# Patient Record
Sex: Female | Born: 1995 | Race: White | Marital: Single | Smoking: Never smoker
Health system: Southern US, Community
[De-identification: ages and names within clinical notes are randomized; demographics above are authoritative.]

## PROBLEM LIST (undated history)

## (undated) DIAGNOSIS — F419 Anxiety disorder, unspecified: Secondary | ICD-10-CM

## (undated) DIAGNOSIS — J302 Other seasonal allergic rhinitis: Secondary | ICD-10-CM

## (undated) DIAGNOSIS — G43909 Migraine, unspecified, not intractable, without status migrainosus: Secondary | ICD-10-CM

## (undated) DIAGNOSIS — R200 Anesthesia of skin: Secondary | ICD-10-CM

## (undated) HISTORY — PX: MANDIBLE FRACTURE SURGERY: SHX706

## (undated) HISTORY — DX: Other seasonal allergic rhinitis: J30.2

## (undated) HISTORY — DX: Anxiety disorder, unspecified: F41.9

## (undated) HISTORY — DX: Anesthesia of skin: R20.0

## (undated) HISTORY — DX: Migraine, unspecified, not intractable, without status migrainosus: G43.909

## (undated) HISTORY — PX: TONSILLECTOMY: SUR1361

---

## 2015-05-15 DIAGNOSIS — R200 Anesthesia of skin: Secondary | ICD-10-CM | POA: Insufficient documentation

## 2015-05-15 HISTORY — DX: Anesthesia of skin: R20.0

## 2016-11-26 ENCOUNTER — Telehealth: Payer: Self-pay | Admitting: Surgery

## 2016-11-26 NOTE — Telephone Encounter (Signed)
Left voice message for patient to call and schedule appointment with a surgeon for periumbilical abdominal pain. [facility]

## 2016-11-30 ENCOUNTER — Other Ambulatory Visit: Payer: Self-pay

## 2016-12-02 ENCOUNTER — Ambulatory Visit: Payer: BLUE CROSS/BLUE SHIELD | Admitting: Surgery

## 2016-12-03 ENCOUNTER — Other Ambulatory Visit: Payer: Self-pay

## 2016-12-03 DIAGNOSIS — J302 Other seasonal allergic rhinitis: Secondary | ICD-10-CM | POA: Insufficient documentation

## 2016-12-03 DIAGNOSIS — F419 Anxiety disorder, unspecified: Secondary | ICD-10-CM

## 2016-12-03 DIAGNOSIS — G43909 Migraine, unspecified, not intractable, without status migrainosus: Secondary | ICD-10-CM | POA: Insufficient documentation

## 2016-12-03 HISTORY — DX: Anxiety disorder, unspecified: F41.9

## 2016-12-04 ENCOUNTER — Encounter: Payer: Self-pay | Admitting: General Surgery

## 2016-12-04 ENCOUNTER — Ambulatory Visit (INDEPENDENT_AMBULATORY_CARE_PROVIDER_SITE_OTHER): Payer: BLUE CROSS/BLUE SHIELD | Admitting: General Surgery

## 2016-12-04 VITALS — BP 117/74 | HR 81 | Temp 97.4°F | Ht 67.0 in | Wt 166.4 lb

## 2016-12-04 DIAGNOSIS — R1033 Periumbilical pain: Secondary | ICD-10-CM

## 2016-12-04 NOTE — Patient Instructions (Signed)
Please call our office if you have any questions or concerns.   Umbilical Hernia, Adult A hernia is a bulge of tissue that pushes through an opening between muscles. An umbilical hernia happens in the abdomen, near the belly button (umbilicus). The hernia may contain tissues from the small intestine, large intestine, or fatty tissue covering the intestines (omentum). Umbilical hernias in adults tend to get worse over time, and they require surgical treatment. There are several types of umbilical hernias. You may have:  A hernia located just above or below the umbilicus (indirect hernia). This is the most common type of umbilical hernia in adults.  A hernia that forms through an opening formed by the umbilicus (direct hernia).  A hernia that comes and goes (reducible hernia). A reducible hernia may be visible only when you strain, lift something heavy, or cough. This type of hernia can be pushed back into the abdomen (reduced).  A hernia that traps abdominal tissue inside the hernia (incarcerated hernia). This type of hernia cannot be reduced.  A hernia that cuts off blood flow to the tissues inside the hernia (strangulated hernia). The tissues can start to die if this happens. This type of hernia requires emergency treatment. What are the causes? An umbilical hernia happens when tissue inside the abdomen presses on a weak area of the abdominal muscles. What increases the risk? You may have a greater risk of this condition if you:  Are obese.  Have had several pregnancies.  Have a buildup of fluid inside your abdomen (ascites).  Have had surgery that weakens the abdominal muscles. What are the signs or symptoms? The main symptom of this condition is a painless bulge at or near the belly button. A reducible hernia may be visible only when you strain, lift something heavy, or cough. Other symptoms may include:  Dull pain.  A feeling of pressure. Symptoms of a strangulated hernia may  include:  Pain that gets increasingly worse.  Nausea and vomiting.  Pain when pressing on the hernia.  Skin over the hernia becoming red or purple.  Constipation.  Blood in the stool. How is this diagnosed? This condition may be diagnosed based on:  A physical exam. You may be asked to cough or strain while standing. These actions increase the pressure inside your abdomen and force the hernia through the opening in your muscles. Your health care provider may try to reduce the hernia by pressing on it.  Your symptoms and medical history. How is this treated? Surgery is the only treatment for an umbilical hernia. Surgery for a strangulated hernia is done as soon as possible. If you have a small hernia that is not incarcerated, you may need to lose weight before having surgery. Follow these instructions at home:  Lose weight, if told by your health care provider.  Do not try to push the hernia back in.  Watch your hernia for any changes in color or size. Tell your health care provider if any changes occur.  You may need to avoid activities that increase pressure on your hernia.  Do not lift anything that is heavier than 10 lb (4.5 kg) until your health care provider says that this is safe.  Take over-the-counter and prescription medicines only as told by your health care provider.  Keep all follow-up visits as told by your health care provider. This is important. Contact a health care provider if:  Your hernia gets larger.  Your hernia becomes painful. Get help right away if:  You develop sudden, severe pain near the area of your hernia.  You have pain as well as nausea or vomiting.  You have pain and the skin over your hernia changes color.  You develop a fever. This information is not intended to replace advice given to you by your health care provider. Make sure you discuss any questions you have with your health care provider. Document Released: 03/20/2016 Document  Revised: 06/21/2016 Document Reviewed: 03/20/2016 Elsevier Interactive Patient Education  2017 [employer].

## 2016-12-04 NOTE — Progress Notes (Signed)
Patient ID: Margaret Beck, female   DOB: 1996-04-22, 21 y.o.   MRN: [id number]  CC: Abdominal pain  HPI Margaret Beck is a 21 y.o. female presents to clinic today for evaluation of abdominal pain. The pain is always above her umbilicus and does not radiate and does not move. She'll he notices it when she strains hard, coughs, sneezes. It first started approximately 5 months ago and then went away for several months and then restarted a couple weeks ago. She states is not every day and only every now and then and usually goes away within a few minutes. She denies any fevers, chills, nausea, vomiting, chest pain, shortness breath, diarrhea, constipation.  HPI  Past Medical History:  Diagnosis Date  . Anxiety 12/03/2016   Overview:  Occasional when stressed out  . Migraine   . Numbness 05/15/2015  . Seasonal allergies     Past Surgical History:  Procedure Laterality Date  . MANDIBLE FRACTURE SURGERY    . TONSILLECTOMY      Family History  Problem Relation Age of Onset  . Anxiety disorder Mother   . Asthma Mother   . Depression Mother   . Colon polyps Father   . Hyperlipidemia Father   . Heart disease Maternal Grandmother   . Skin cancer Maternal Grandmother   . Colon cancer Maternal Grandfather   . Colon cancer Paternal Grandfather     Social History Social History  Substance Use Topics  . Smoking status: Never Smoker  . Smokeless tobacco: Never Used  . Alcohol use No    No Known Allergies  Current Outpatient Prescriptions  Medication Sig Dispense Refill  . fluticasone (FLONASE) 50 MCG/ACT nasal spray Place 2 sprays into the nose daily.     No current facility-administered medications for this visit.      Review of Systems A Multi-point review of systems was asked and was negative except for the findings documented in the history of present illness  Physical Exam Blood pressure 117/74, pulse 81, temperature 97.4 F (36.3 C), temperature source Oral, height 5\' 7"   (1.702 m), weight 75.5 kg (166 lb 6.4 oz), last menstrual period 12/04/2016. CONSTITUTIONAL: No acute distress. EYES: Pupils are equal, round, and reactive to light, Sclera are non-icteric. EARS, NOSE, MOUTH AND THROAT: The oropharynx is clear. The oral mucosa is pink and moist. Hearing is intact to voice. LYMPH NODES:  Lymph nodes in the neck are normal. RESPIRATORY:  Lungs are clear. There is normal respiratory effort, with equal breath sounds bilaterally, and without pathologic use of accessory muscles. CARDIOVASCULAR: Heart is regular without murmurs, gallops, or rubs. GI: The abdomen is soft, nontender, and nondistended. There are no palpable masses. There is no hepatosplenomegaly. There are normal bowel sounds in all quadrants. Possible area of fascial disruption just cephalad to the umbilicus but without bulge or tenderness. GU: Rectal deferred.   MUSCULOSKELETAL: Normal muscle strength and tone. No cyanosis or edema.   SKIN: Turgor is good and there are no pathologic skin lesions or ulcers. NEUROLOGIC: Motor and sensation is grossly normal. Cranial nerves are grossly intact. PSYCH:  Oriented to person, place and time. Affect is normal.  Data Reviewed No images and labs reviewed for this encounter I have personally reviewed the patient's imaging, laboratory findings and medical records.    Assessment    Periumbilical pain    Plan    21 year old female with periumbilical pain that happens with changes and abdominal pressure. This is likely a small ventral hernia. Unable to  fully of appreciated on exam today. Counseled her just to the diagnosis as well as the treatment options. At this point given its small size and minimal symptoms discussed that repair would be purely elective. Counseled as to the signs and symptoms of incarceration or straining relation of her hernia and to report immediately to the hospital should they occur. Otherwise discussed that should become more symptomatic  for the patient desires surgical repair that she is to call clinic follow-up at that point. She voiced understanding and will follow up on an as-needed basis.     Time spent with the patient was 45 minutes, with more than 50% of the time spent in face-to-face education, counseling and care coordination.     Margaret Frameharles Javarri Segal, MD FACS General Surgeon 12/04/2016, 9:59 AM

## 2017-02-19 ENCOUNTER — Ambulatory Visit: Payer: BLUE CROSS/BLUE SHIELD | Admitting: General Surgery

## 2017-02-22 ENCOUNTER — Encounter: Payer: Self-pay | Admitting: General Surgery

## 2017-02-22 ENCOUNTER — Ambulatory Visit (INDEPENDENT_AMBULATORY_CARE_PROVIDER_SITE_OTHER): Payer: BLUE CROSS/BLUE SHIELD | Admitting: General Surgery

## 2017-02-22 VITALS — BP 110/72 | HR 89 | Temp 98.2°F | Ht 67.0 in | Wt 168.0 lb

## 2017-02-22 DIAGNOSIS — R1033 Periumbilical pain: Secondary | ICD-10-CM

## 2017-02-22 NOTE — Patient Instructions (Addendum)
We scheduled your Ultrasound for 03/01/17 @ 7:45 am. Please do not have anything to eat or drink after midnight the night prior to having your scan.  It is scheduled at the  Outpatient imaging [address] [place]  Please see your follow up appointment listed below. Please call if you have questions or concerns.

## 2017-02-22 NOTE — Progress Notes (Signed)
Outpatient Surgical Follow Up  02/22/2017  Margaret Beck is an 21 y.o. female.   Chief Complaint  Patient presents with  . Follow-up    Discuss Ventral Hernia Surgery    HPI: 21 year old female returns to clinic for follow-up of abdominal pain. Patient reports she was last seen she has continued to have intermittent pains with activities. She had one event where she was bowling a few weeks ago where afterwards she had pain followed by nausea and vomiting. She denies any bulge or having to reduce anything. Since that time she has only had occasional twinges of pain. She denies any changes in bowel habits. She denies any fevers, chills, chest pain, shortness of breath, diarrhea, constipation.  Past Medical History:  Diagnosis Date  . Anxiety 12/03/2016   Overview:  Occasional when stressed out  . Migraine   . Numbness 05/15/2015  . Seasonal allergies     Past Surgical History:  Procedure Laterality Date  . MANDIBLE FRACTURE SURGERY    . TONSILLECTOMY      Family History  Problem Relation Age of Onset  . Anxiety disorder Mother   . Asthma Mother   . Depression Mother   . Colon polyps Father   . Hyperlipidemia Father   . Heart disease Maternal Grandmother   . Skin cancer Maternal Grandmother   . Colon cancer Maternal Grandfather   . Colon cancer Paternal Grandfather     Social History:  reports that she has never smoked. She has never used smokeless tobacco. She reports that she does not drink alcohol or use drugs.  Allergies: No Known Allergies  Medications reviewed.    ROS A multipoint review of systems was completed, all pertinent positives and negatives are documented in the history of present illness and remainder are negative    BP 110/72   Pulse 89   Temp 98.2 F (36.8 C) (Oral)   Ht  (1.702 m)   Wt 76.2 kg (168 lb)   LMP 01/19/2017   BMI 26.31 kg/m   Physical Exam Gen.: No acute distress Chest: Clear to auscultation Heart: Regular rate and  rhythm Abdomen: Soft, nondistended. Mild tenderness to deep palpation just above the umbilicus. No bulges clearly felt nor is there any obvious fascial defect even with Valsalva. Extremities: Moves all surgeries well    No results found for this or any previous visit (from the past 48 hour(s)). No results found.  Assessment/Plan:  1. Periumbilical abdominal pain 21 year old female with periumbilical pain that is worsened with activity. There is been a question of a hernia there for the last 2 visits without a clear palpable fascial defect. Discussed that we will obtain an ultrasound of the area to look for a fascial defect prior to embarking on any surgical intervention. All questions answered to patient's satisfaction. She'll follow-up in clinic after the ultrasound. - US Abdomen Complete; Future     Ricarda Frame, MD [facility] General Surgeon  02/22/2017,10:37 AM

## 2017-03-01 ENCOUNTER — Ambulatory Visit
Admission: RE | Admit: 2017-03-01 | Discharge: 2017-03-01 | Disposition: A | Discharge status: Home / Self Care | Payer: BLUE CROSS/BLUE SHIELD | Source: Ambulatory Visit | Attending: General Surgery | Admitting: General Surgery

## 2017-03-01 ENCOUNTER — Other Ambulatory Visit: Payer: Self-pay

## 2017-03-01 DIAGNOSIS — R1033 Periumbilical pain: Secondary | ICD-10-CM | POA: Insufficient documentation

## 2017-03-08 ENCOUNTER — Telehealth: Payer: Self-pay

## 2017-03-08 ENCOUNTER — Telehealth: Payer: Self-pay | Admitting: General Practice

## 2017-03-08 NOTE — Telephone Encounter (Signed)
Patient notified of Ultrasound results per Dr.Woodham . No need to keep the scheduled appointment. Patient verbalized understanding.

## 2017-03-08 NOTE — Telephone Encounter (Signed)
Patient called and is asking for her results from an ultrasound she had. Please call patient and advice.

## 2017-03-09 NOTE — Telephone Encounter (Signed)
error 

## 2017-03-17 ENCOUNTER — Ambulatory Visit: Payer: BLUE CROSS/BLUE SHIELD | Admitting: General Surgery

## 2017-09-04 IMAGING — US US ABDOMEN LIMITED
1 series · 10 of 10 positions shown · non-contrast
Comparison: None.

CLINICAL DATA: Periumbilical pain for several months

EXAM:
LIMITED ABDOMINAL ULTRASOUND

[Series 1: us abdomen limited · 0.08mm/px · 10 of 10 slices shown]
[im 1/10]
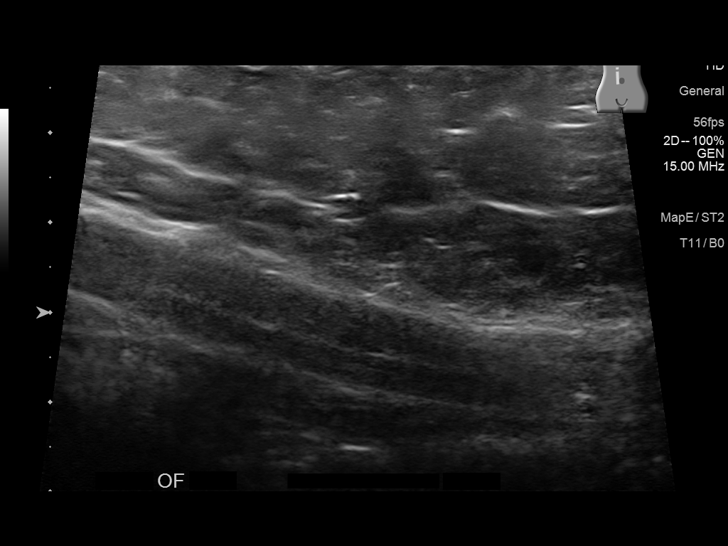
[im 2/10]
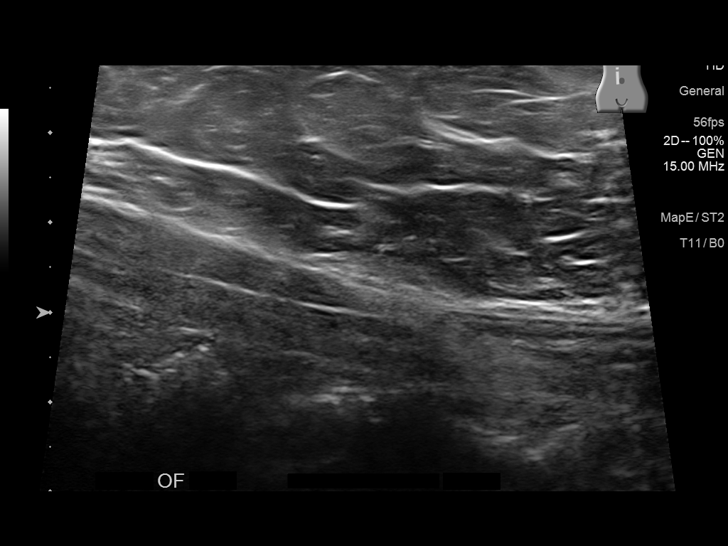
[im 3/10]
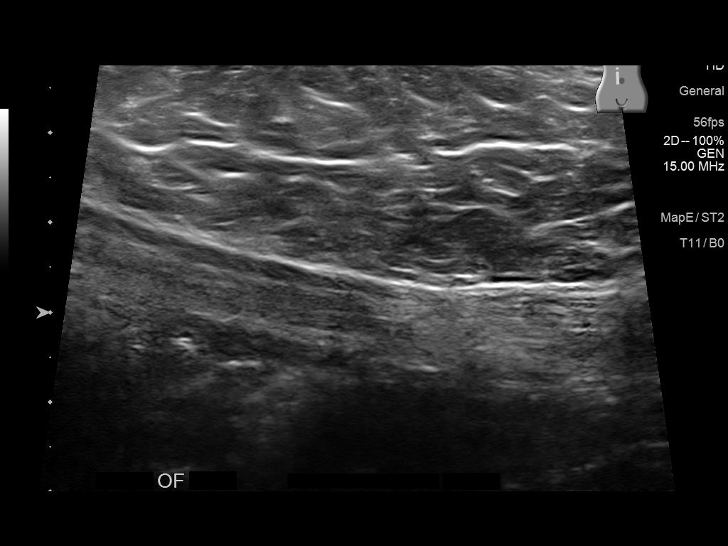
[im 4/10]
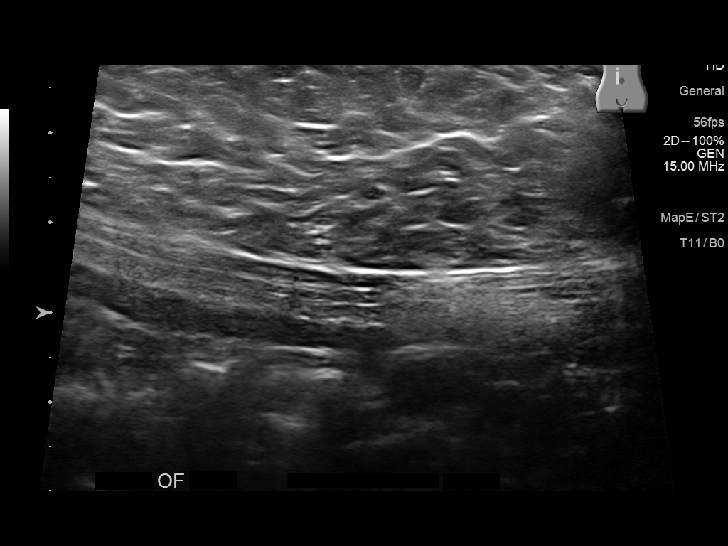
[im 5/10]
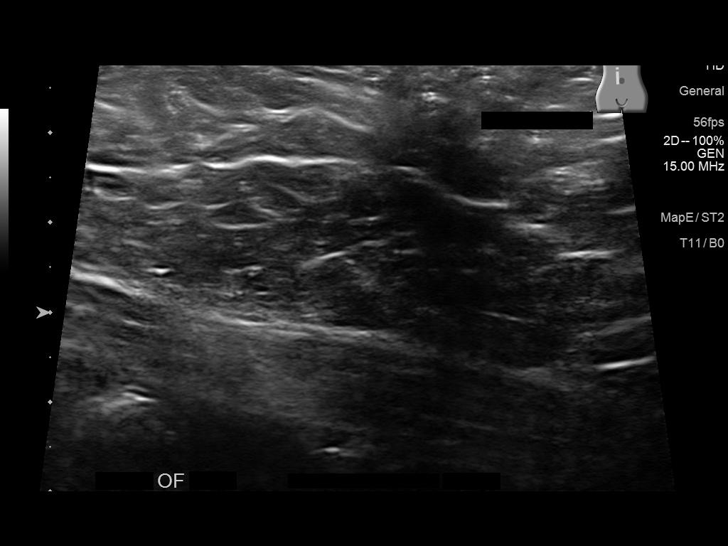
[im 6/10]
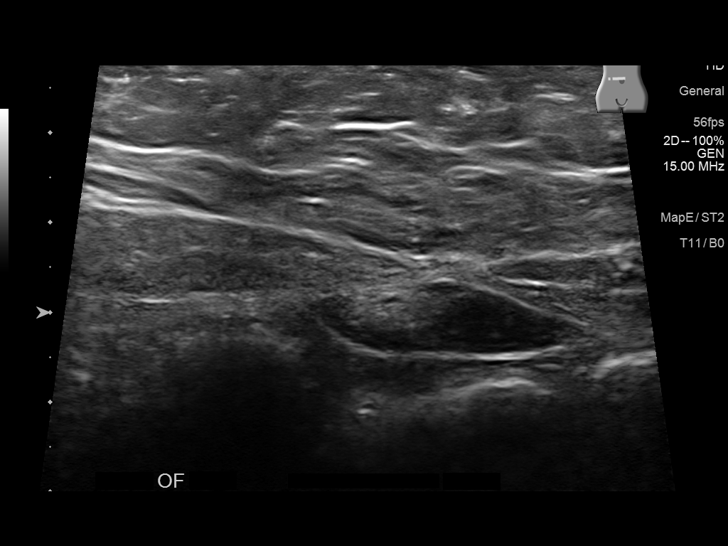
[im 7/10]
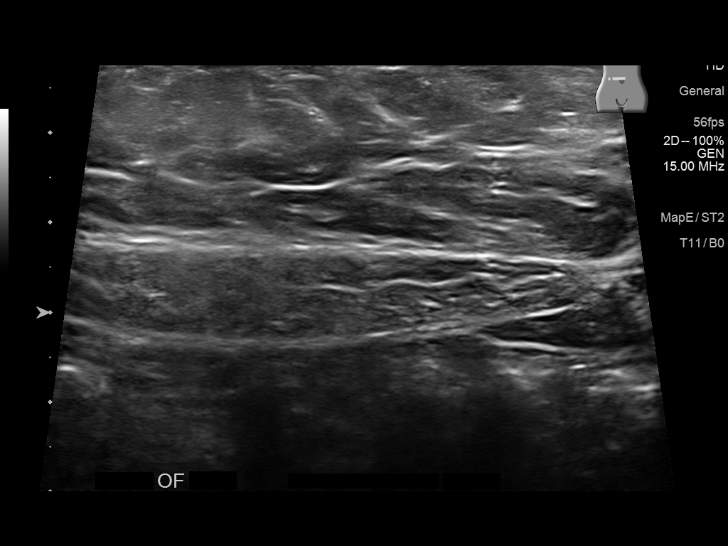
[im 8/10]
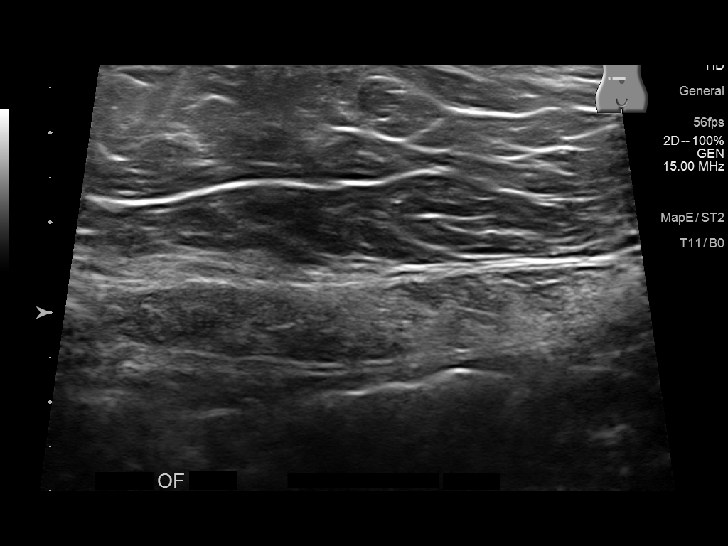
[im 9/10]
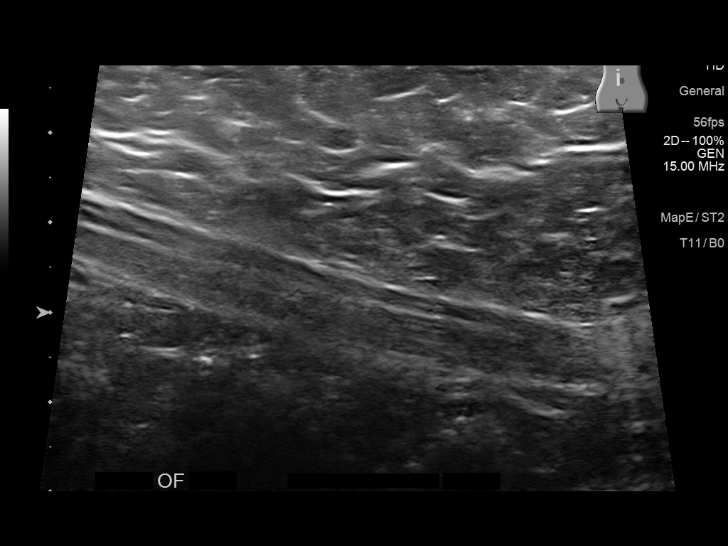
[im 10/10]
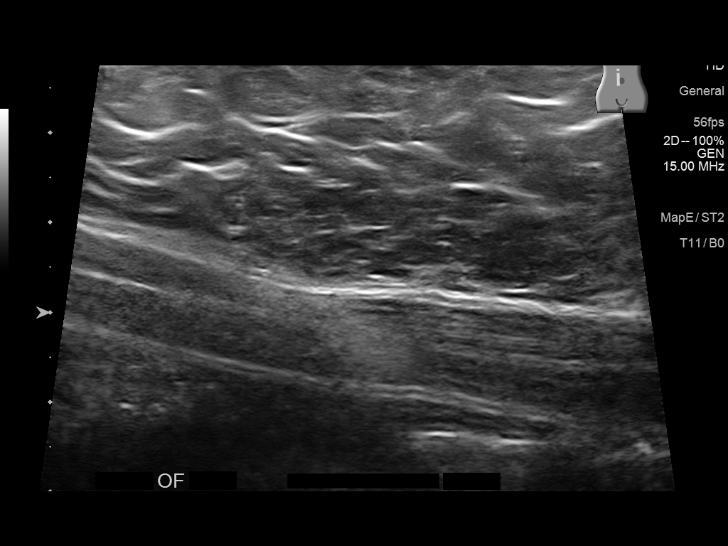

[10 of 10 positions shown; findings below may reference images not displayed]

FINDINGS: Scanning in the periumbilical region shows no evidence of focal
hernia. No soft tissue abnormality is noted.
IMPRESSION: No evidence of hernia.

## 2019-03-25 IMAGING — US US ABDOMEN COMPLETE
1 series · 14 of 25 positions shown · non-contrast
Comparison: None.

CLINICAL DATA: Patient with periumbilical abdominal pain.

EXAM:
ABDOMEN ULTRASOUND COMPLETE

[Series 1: us abdomen complete · 0.19mm/px · 14 of 101 slices shown]
[im 1/101]
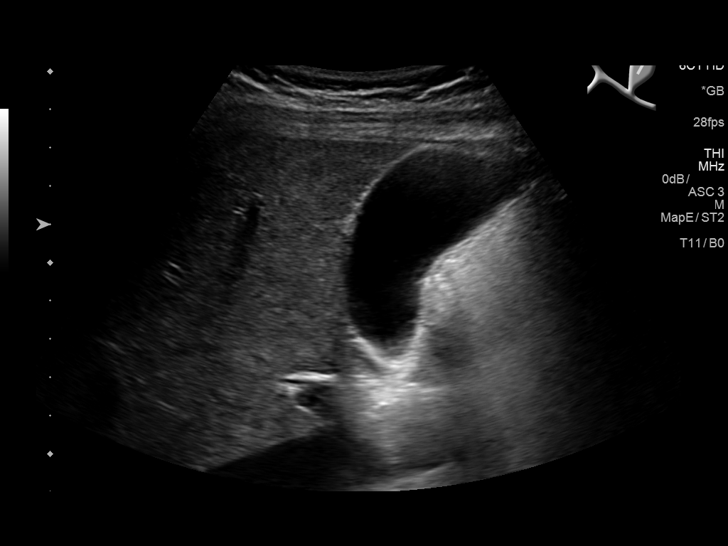
[im 9/101]
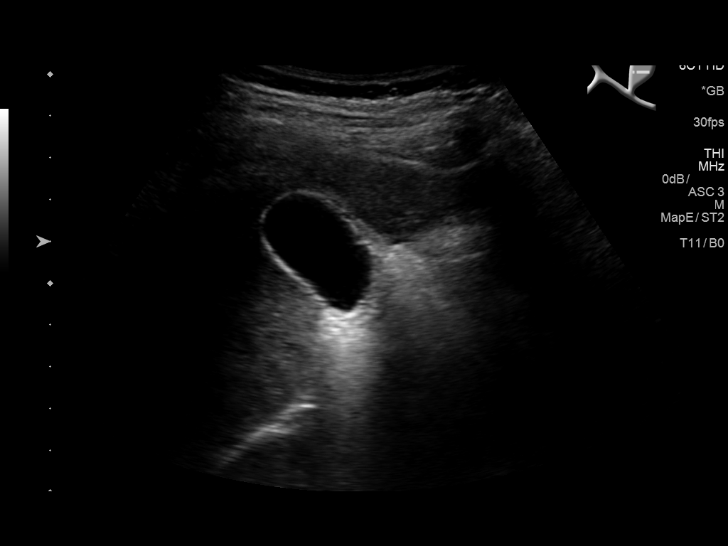
[im 17/101]
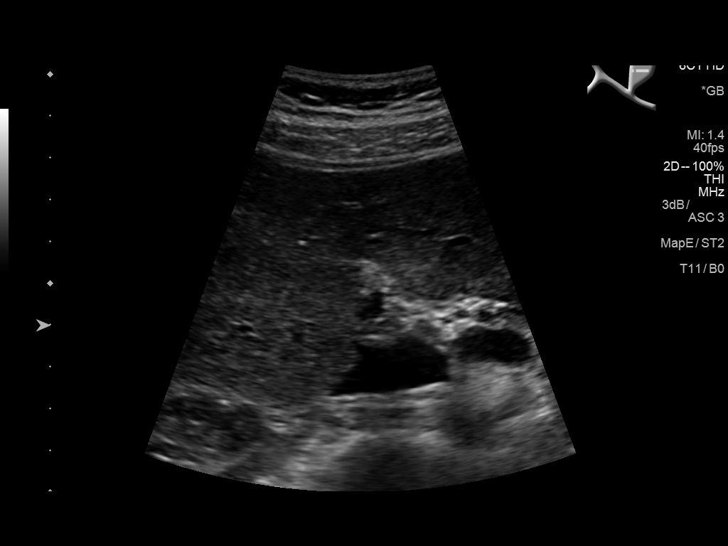
[im 26/101]
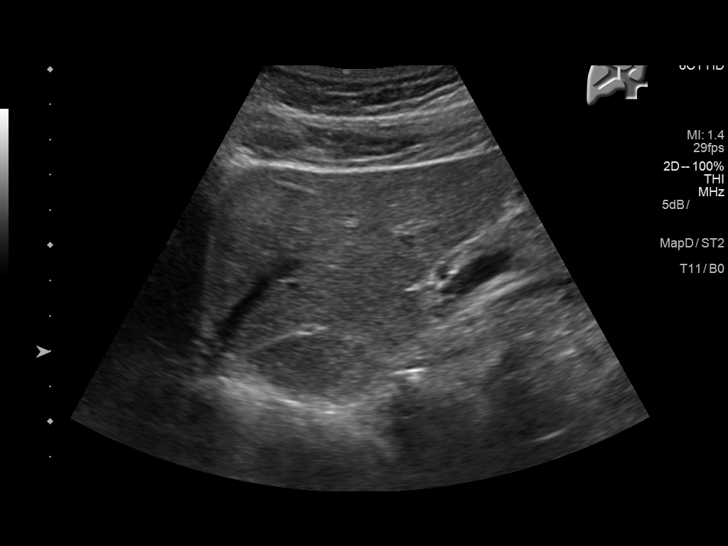
[im 34/101]
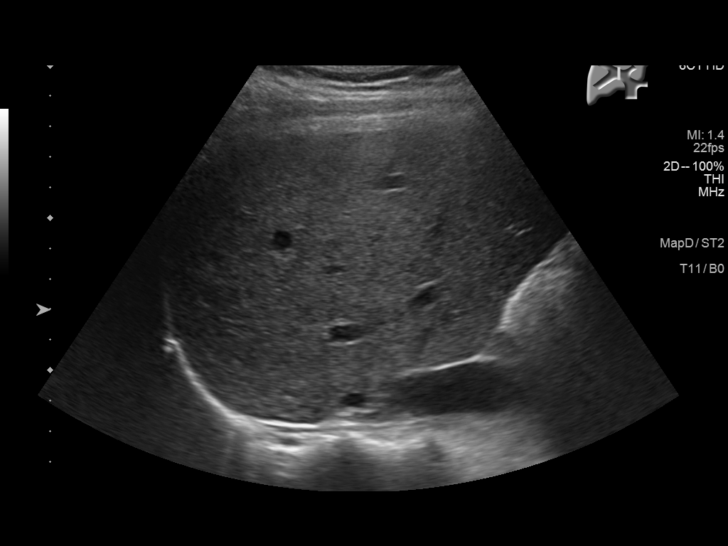
[im 38/101]
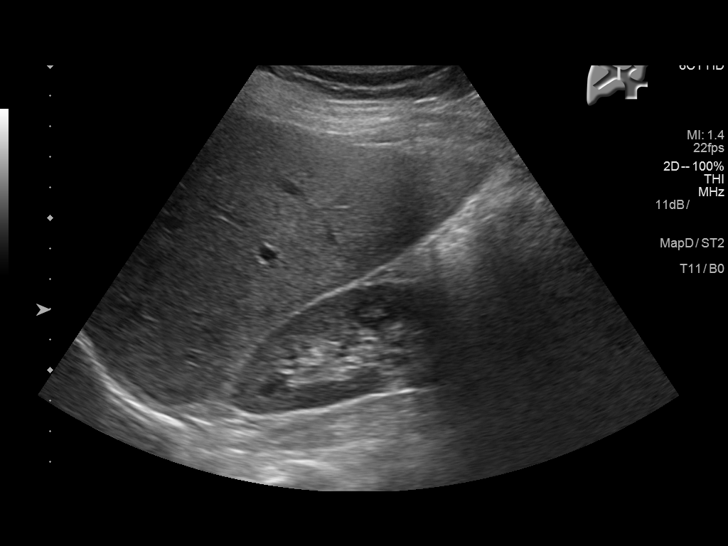
[im 46/101]
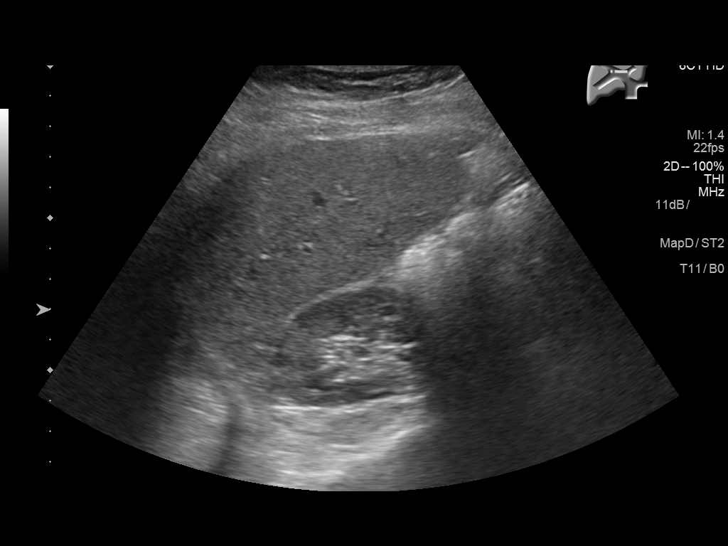
[im 55/101]
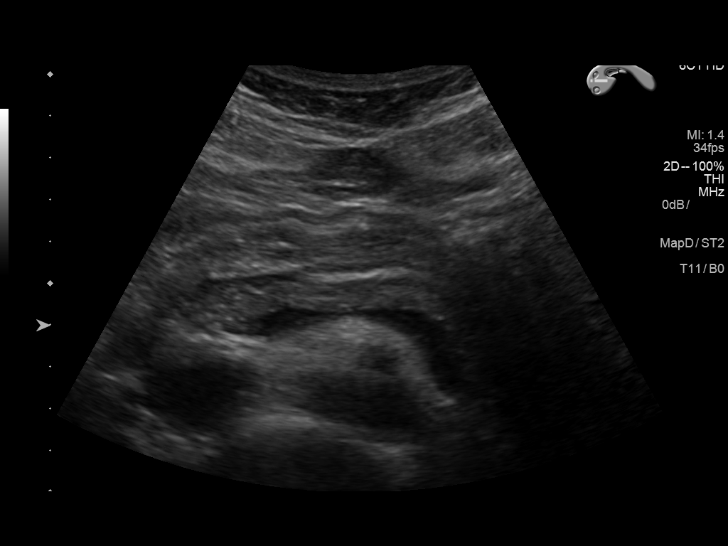
[im 63/101]
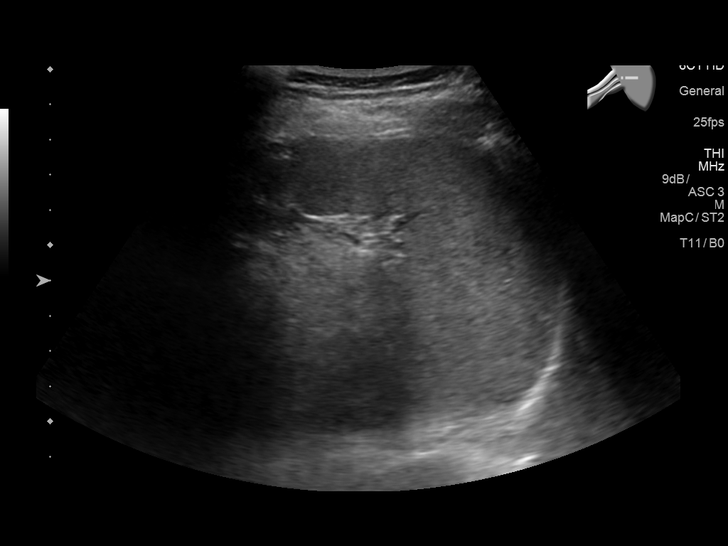
[im 67/101]
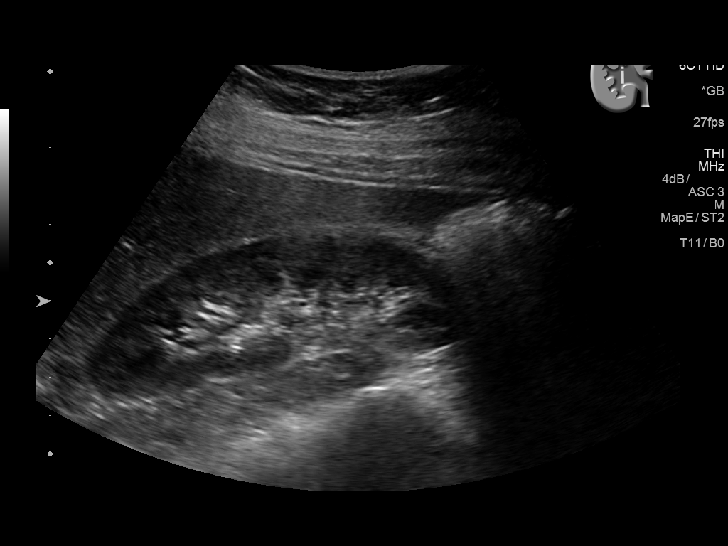
[im 76/101]
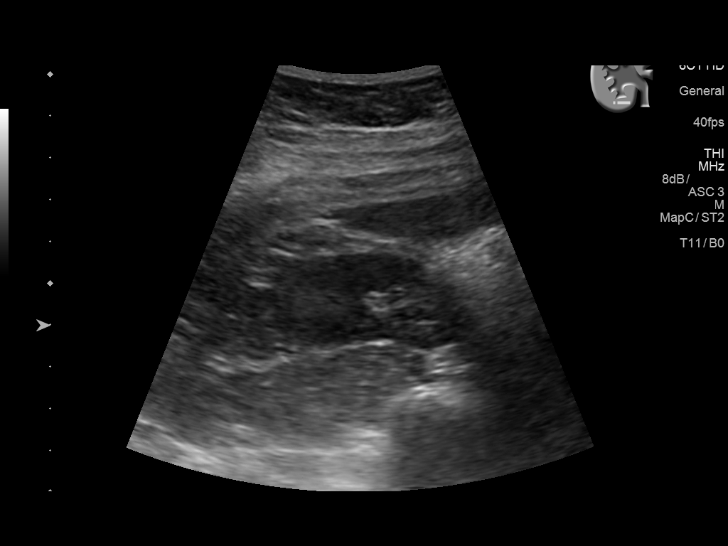
[im 84/101]
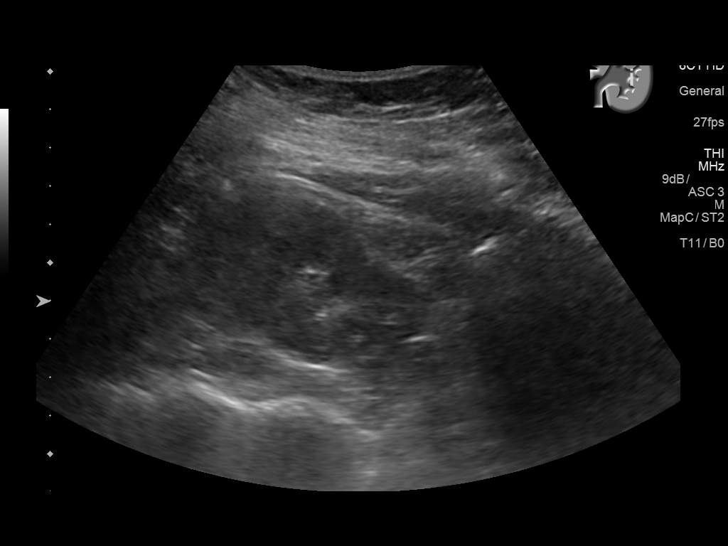
[im 92/101]
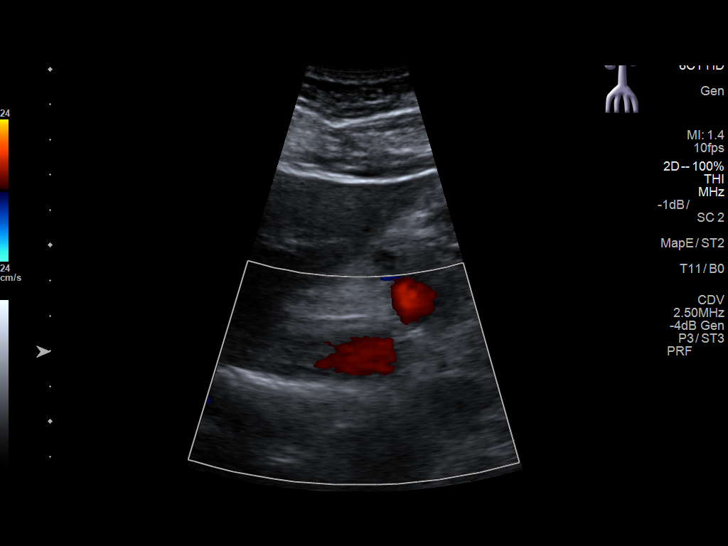
[im 101/101]
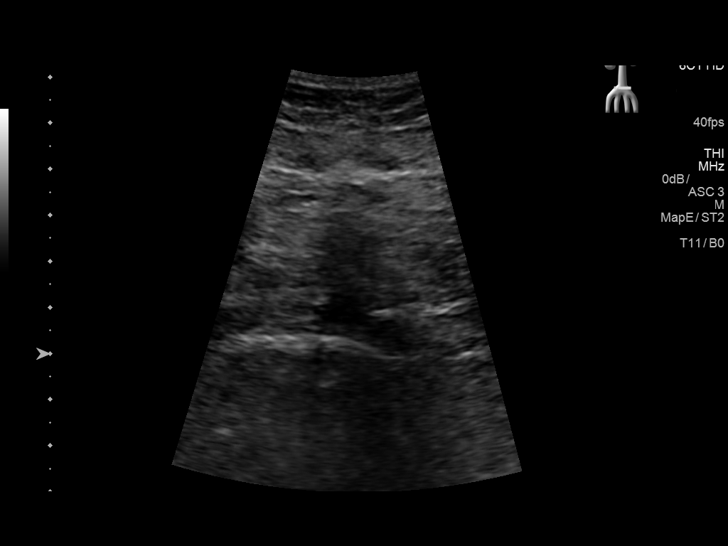

[14 of 25 positions shown; findings below may reference images not displayed]

FINDINGS: Gallbladder: No gallstones or wall thickening visualized. No
sonographic Murphy sign noted by sonographer.

Common bile duct: Diameter: 3 mm

Liver: No focal lesion identified. Within normal limits in
parenchymal echogenicity.

IVC: No abnormality visualized.

Pancreas: Visualized portion unremarkable.

Spleen: Size and appearance within normal limits.

Right Kidney: Length: 10.2 cm. Echogenicity within normal limits. No
mass or hydronephrosis visualized.

Left Kidney: Length: 10.1 cm. Echogenicity within normal limits. No
mass or hydronephrosis visualized.

Abdominal aorta: No aneurysm visualized.

Other findings: None.
IMPRESSION: Unremarkable abdominal ultrasound.
